# Patient Record
Sex: Male | Born: 1955 | Race: White | Hispanic: No | Marital: Married | Smoking: Never smoker
Health system: Southern US, Community
[De-identification: ages and names within clinical notes are randomized; demographics above are authoritative.]

## PROBLEM LIST (undated history)

## (undated) DIAGNOSIS — T753XXA Motion sickness, initial encounter: Secondary | ICD-10-CM

## (undated) DIAGNOSIS — M1990 Unspecified osteoarthritis, unspecified site: Secondary | ICD-10-CM

## (undated) HISTORY — PX: WISDOM TOOTH EXTRACTION: SHX21

## (undated) HISTORY — PX: COLONOSCOPY: SHX174

---

## 1978-03-04 HISTORY — PX: KNEE ARTHROSCOPY: SUR90

## 2007-07-16 ENCOUNTER — Ambulatory Visit: Payer: Self-pay | Admitting: Unknown Physician Specialty

## 2012-07-01 ENCOUNTER — Encounter (HOSPITAL_BASED_OUTPATIENT_CLINIC_OR_DEPARTMENT_OTHER): Payer: Self-pay | Admitting: *Deleted

## 2012-07-01 ENCOUNTER — Other Ambulatory Visit: Payer: Self-pay | Admitting: Orthopedic Surgery

## 2012-07-01 NOTE — Progress Notes (Signed)
No labs needed

## 2012-07-01 NOTE — H&P (Addendum)
  Ronald Watson is an 57 y.o. male.   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  On 06/29/12 he sustained a direct blow and laceration to the ulnar aspect of his right small finger.  He was seen at the [facility] where his wound was cleaned and sutured. He was provided tetanus prophylaxis.  He is noted subluxation of his extensor tendon with flexion of the finger.  He is now referred for an upper extremity orthopaedic consult to correct his finger predicament.    Past Medical History  Diagnosis Date  . Medical history non-contributory     Past Surgical History  Procedure Laterality Date  . Wisdom tooth extraction    . Knee arthroscopy  1980    left  . Colonoscopy      History reviewed. No pertinent family history. Social History:  reports that he has never smoked. He does not have any smokeless tobacco history on file. He reports that  drinks alcohol. He reports that he does not use illicit drugs.  Allergies: No Known Allergies  No prescriptions prior to admission    No results found for this or any previous visit (from the past 48 hour(s)).  No results found.   Pertinent items are noted in HPI.  Height 6' (1.829 m), weight 96.163 kg (212 lb).  General appearance: alert Head: Normocephalic, without obvious abnormality Neck: supple, symmetrical, trachea midline Resp: clear to auscultation bilaterally Cardio: regular rate and rhythm GI: normal findings: bowel sounds normal Extremities:  Inspection of his hand reveals sutured wound. There is no sign of infection. He can fully extend the finger at the PIP joint, however, with flexion his extensor tendon sublux radially.  He appears to have a disruption of his ulnar sagittal fibers of the extensor retinaculum.  He has intact sensibility. His flexor tendon function appears to be normal.    X-rays, three  views of the small finger from the [facility] dated 06/29/12 are reviewed. He does not appear to have any significant fracture predicament.   Pulses: 2+ and symmetric Skin: normal Neurologic: Grossly normal    Assessment/Plan Impression:Extensor tendon laceration right small finger  Plan:To the OR for exploration/repair tendons right small finger.The procedure, risks,benefits and post-op course were discussed with the patient at length and they were in agreement with the plan.  Kimmi Acocella J 07/01/2012, 3:31 PM   H&P documentation: 07/02/2012  -History and Physical Reviewed  -Patient has been re-examined  -No change in the plan of care  Wyn Forster, MD

## 2012-07-02 ENCOUNTER — Encounter (HOSPITAL_BASED_OUTPATIENT_CLINIC_OR_DEPARTMENT_OTHER): Payer: Self-pay | Admitting: Anesthesiology

## 2012-07-02 ENCOUNTER — Encounter (HOSPITAL_BASED_OUTPATIENT_CLINIC_OR_DEPARTMENT_OTHER): Payer: Self-pay | Admitting: *Deleted

## 2012-07-02 ENCOUNTER — Encounter (HOSPITAL_BASED_OUTPATIENT_CLINIC_OR_DEPARTMENT_OTHER)
Admission: RE | Disposition: A | Discharge status: Home / Self Care | Payer: Self-pay | Source: Ambulatory Visit | Attending: Orthopedic Surgery

## 2012-07-02 ENCOUNTER — Ambulatory Visit (HOSPITAL_BASED_OUTPATIENT_CLINIC_OR_DEPARTMENT_OTHER)
Admission: RE | Admit: 2012-07-02 | Discharge: 2012-07-02 | Disposition: A | Discharge status: Home / Self Care | Payer: Worker's Compensation | Source: Ambulatory Visit | Attending: Orthopedic Surgery | Admitting: Orthopedic Surgery

## 2012-07-02 DIAGNOSIS — X58XXXA Exposure to other specified factors, initial encounter: Secondary | ICD-10-CM | POA: Insufficient documentation

## 2012-07-02 DIAGNOSIS — S61209A Unspecified open wound of unspecified finger without damage to nail, initial encounter: Secondary | ICD-10-CM | POA: Insufficient documentation

## 2012-07-02 HISTORY — PX: TENDON REPAIR: SHX5111

## 2012-07-02 SURGERY — MINOR TENDON REPAIR
Anesthesia: LOCAL | Site: Finger | Laterality: Right | Wound class: Clean

## 2012-07-02 MED ORDER — LIDOCAINE HCL 2 % IJ SOLN
INTRAMUSCULAR | Status: DC | PRN
  Administered 2012-07-02: 3 mL

## 2012-07-02 MED ORDER — ACETAMINOPHEN-CODEINE #3 300-30 MG PO TABS: 1.0000 | ORAL_TABLET | ORAL | Status: DC | PRN

## 2012-07-02 MED ORDER — LACTATED RINGERS IV SOLN
INTRAVENOUS | Status: DC
  Administered 2012-07-02: 07:00:00 via INTRAVENOUS

## 2012-07-02 MED ORDER — CEPHALEXIN 500 MG PO CAPS: 500.0000 mg | ORAL_CAPSULE | Freq: Three times a day (TID) | ORAL | Status: DC

## 2012-07-02 MED ORDER — MIDAZOLAM HCL 2 MG/2ML IJ SOLN: 1.0000 mg | INTRAMUSCULAR | Status: DC | PRN

## 2012-07-02 MED ORDER — FENTANYL CITRATE 0.05 MG/ML IJ SOLN: 50.0000 ug | INTRAMUSCULAR | Status: DC | PRN

## 2012-07-02 SURGICAL SUPPLY — 79 items
BANDAGE ADHESIVE 1X3 (GAUZE/BANDAGES/DRESSINGS) IMPLANT
BANDAGE ELASTIC 3 VELCRO ST LF (GAUZE/BANDAGES/DRESSINGS) IMPLANT
BANDAGE GAUZE ELAST BULKY 4 IN (GAUZE/BANDAGES/DRESSINGS) IMPLANT
BLADE MINI RND TIP GREEN BEAV (BLADE) ×2 IMPLANT
BLADE SURG 15 STRL LF DISP TIS (BLADE) ×1 IMPLANT
BLADE SURG 15 STRL SS (BLADE) ×1
BNDG COHESIVE 1X5 TAN STRL LF (GAUZE/BANDAGES/DRESSINGS) ×2 IMPLANT
BNDG ELASTIC 2 VLCR STRL LF (GAUZE/BANDAGES/DRESSINGS) IMPLANT
BNDG ESMARK 4X9 LF (GAUZE/BANDAGES/DRESSINGS) IMPLANT
BRUSH SCRUB EZ PLAIN DRY (MISCELLANEOUS) ×2 IMPLANT
CATH ROBINSON RED A/P 10FR (CATHETERS) IMPLANT
CATH ROBINSON RED A/P 12FR (CATHETERS) IMPLANT
CLOTH BEACON ORANGE TIMEOUT ST (SAFETY) ×2 IMPLANT
CORDS BIPOLAR (ELECTRODE) ×2 IMPLANT
COVER MAYO STAND STRL (DRAPES) ×2 IMPLANT
COVER TABLE BACK 60X90 (DRAPES) ×2 IMPLANT
CUFF TOURNIQUET SINGLE 18IN (TOURNIQUET CUFF) IMPLANT
DECANTER SPIKE VIAL GLASS SM (MISCELLANEOUS) IMPLANT
DRAIN PENROSE 1/4X12 LTX STRL (WOUND CARE) IMPLANT
DRAPE EXTREMITY T 121X128X90 (DRAPE) ×2 IMPLANT
DRAPE SURG 17X23 STRL (DRAPES) ×2 IMPLANT
GAUZE SPONGE 4X4 16PLY XRAY LF (GAUZE/BANDAGES/DRESSINGS) IMPLANT
GAUZE XEROFORM 1X8 LF (GAUZE/BANDAGES/DRESSINGS) ×2 IMPLANT
GLOVE BIOGEL M STRL SZ7.5 (GLOVE) ×2 IMPLANT
GLOVE BIOGEL PI IND STRL 6.5 (GLOVE) ×2 IMPLANT
GLOVE BIOGEL PI INDICATOR 6.5 (GLOVE) ×2
GLOVE ECLIPSE 6.5 STRL STRAW (GLOVE) ×2 IMPLANT
GLOVE ORTHO TXT STRL SZ7.5 (GLOVE) ×2 IMPLANT
GOWN BRE IMP PREV XXLGXLNG (GOWN DISPOSABLE) ×4 IMPLANT
GOWN PREVENTION PLUS XLARGE (GOWN DISPOSABLE) ×2 IMPLANT
NEEDLE 27GAX1X1/2 (NEEDLE) IMPLANT
NEEDLE ADDISON D1/2 CIR (NEEDLE) IMPLANT
NEEDLE HYPO 25X1 1.5 SAFETY (NEEDLE) IMPLANT
NEEDLE KEITH (NEEDLE) IMPLANT
NEEDLE KEITH SZ10 STRAIGHT (NEEDLE) IMPLANT
NS IRRIG 1000ML POUR BTL (IV SOLUTION) ×2 IMPLANT
PACK BASIN DAY SURGERY FS (CUSTOM PROCEDURE TRAY) ×2 IMPLANT
PAD CAST 3X4 CTTN HI CHSV (CAST SUPPLIES) IMPLANT
PADDING CAST ABS 3INX4YD NS (CAST SUPPLIES)
PADDING CAST ABS 4INX4YD NS (CAST SUPPLIES) ×1
PADDING CAST ABS COTTON 3X4 (CAST SUPPLIES) IMPLANT
PADDING CAST ABS COTTON 4X4 ST (CAST SUPPLIES) ×1 IMPLANT
PADDING CAST COTTON 3X4 STRL (CAST SUPPLIES)
PASSER SUT SWANSON 36MM LOOP (INSTRUMENTS) IMPLANT
SLEEVE SCD COMPRESS KNEE MED (MISCELLANEOUS) IMPLANT
SPLINT FINGER 5/8X3.25 (SOFTGOODS) ×1 IMPLANT
SPLINT FINGER FOAM 3 9119 05 (SOFTGOODS) ×2
SPLINT PLASTER CAST XFAST 3X15 (CAST SUPPLIES) IMPLANT
SPLINT PLASTER XTRA FASTSET 3X (CAST SUPPLIES)
SPONGE GAUZE 4X4 12PLY (GAUZE/BANDAGES/DRESSINGS) ×2 IMPLANT
STOCKINETTE 4X48 STRL (DRAPES) ×2 IMPLANT
STOCKINETTE 6  STRL (DRAPES)
STOCKINETTE 6 STRL (DRAPES) IMPLANT
STRIP CLOSURE SKIN 1/2X4 (GAUZE/BANDAGES/DRESSINGS) ×2 IMPLANT
SUT ETHIBOND 3-0 V-5 (SUTURE) IMPLANT
SUT ETHILON 5 0 P 3 18 (SUTURE)
SUT FIBERWIRE 3-0 18 TAPR NDL (SUTURE)
SUT FIBERWIRE 4-0 18 TAPR NDL (SUTURE)
SUT MERSILENE 4 0 P 3 (SUTURE) ×2 IMPLANT
SUT MERSILENE 6 0 P 1 (SUTURE) IMPLANT
SUT MERSILENE 6 0 S14 DA (SUTURE) IMPLANT
SUT NYLON ETHILON 5-0 P-3 1X18 (SUTURE) IMPLANT
SUT POLY BUTTON 15MM (SUTURE) IMPLANT
SUT PROLENE 2 0 SH DA (SUTURE) IMPLANT
SUT PROLENE 3 0 PS 2 (SUTURE) IMPLANT
SUT PROLENE 4 0 PS 2 18 (SUTURE) ×2 IMPLANT
SUT SILK 4 0 PS 2 (SUTURE) ×2 IMPLANT
SUT VIC AB 4-0 SH 27 (SUTURE)
SUT VIC AB 4-0 SH 27XANBCTRL (SUTURE) IMPLANT
SUTURE FIBERWR 3-0 18 TAPR NDL (SUTURE) IMPLANT
SUTURE FIBERWR 4-0 18 TAPR NDL (SUTURE) IMPLANT
SYR 3ML 23GX1 SAFETY (SYRINGE) IMPLANT
SYR 5ML LL (SYRINGE) ×2 IMPLANT
SYR BULB 3OZ (MISCELLANEOUS) IMPLANT
SYR CONTROL 10ML LL (SYRINGE) ×2 IMPLANT
TOWEL OR 17X24 6PK STRL BLUE (TOWEL DISPOSABLE) ×2 IMPLANT
TRAY DSU PREP LF (CUSTOM PROCEDURE TRAY) ×2 IMPLANT
TUBE FEEDING 5FR 15 INCH (TUBING) IMPLANT
UNDERPAD 30X30 INCONTINENT (UNDERPADS AND DIAPERS) ×2 IMPLANT

## 2012-07-02 NOTE — Op Note (Signed)
[id number] 

## 2012-07-02 NOTE — Anesthesia Preprocedure Evaluation (Deleted)

## 2012-07-02 NOTE — Op Note (Signed)
NAME:  Ronald Watson, Ronald Watson                ACCOUNT NO.:  [id number]  MEDICAL RECORD NO.:  [id number]  LOCATION:                                 FACILITY:  PHYSICIAN:  Ronald Watson, M.D. DATE OF BIRTH:  1956/02/20  DATE OF PROCEDURE:  07/02/2012 DATE OF DISCHARGE:                              OPERATIVE REPORT   PREOPERATIVE DIAGNOSIS:  Laceration dorsal aspect, right small finger overlying the PIP joint with extensor subluxation due to partial extensor laceration and extensor retinacular laceration.  POSTOPERATIVE DIAGNOSIS:  A 50% laceration of extensor overlying proximal interphalangeal joint including central slip and lateral band, and transverse retinacular fibers.  OPERATION:  Repair of extensor mechanism overlying the PIP joint, right small finger.  OPERATING SURGEON:  Ronald Fitch. Aleister Lady, MD.  ASSISTANT:  Surgical technician.  ANESTHESIA:  2% lidocaine metacarpal head level block, right small finger 3 mL of 2% lidocaine without epinephrine.  ANESTHETIST:  Ronald Watson, M.D.  This was a Ronald Watson operating room procedure.  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  [facility] where he was noted to have an extensor tendon laceration. He was given tetanus prophylaxis.  His wound was cleansed and sutured with nylon suture.  He was referred for a followup with Orthopedic and Hand specialist.  Clinical examination revealed extensor impairment with triggering and near locking in partial flexion due to extensor subluxation.  We advised exploration and repair of the extensor mechanism.  Preoperative x-rays revealed no abnormalities other than soft tissue injury.  Preoperative detailed informed consent was provided in the office and in the holding area.  Questions were invited and answered in detail.  DESCRIPTION OF PROCEDURE:  Ronald Watson was interviewed in the  holding area, and his right small finger was marked as the proper surgical site per protocol.  He was transferred to room 1 of the [facility] where Ronald [facility] was used to prep his hand followed by infiltration of 3 mL of metacarpal head level to obtain a digital block.  His right hand and arm were then prepped with Ronald Watson, sterilely draped.  A pneumatic tourniquet was applied to the proximal forearm.  Sterile stockinette and impervious arthroscopy drapes were applied.  Following the routine surgical time-out, procedure commenced with removal of the prior sutures from the [facility].  The wound was explored and there was noted to be a 50% laceration of the extensor mechanism including part of the central slip.  The ulnar lateral band and the transverse retinacular fibers.  An incision was fashioned in the midline dorsally over the most radial aspect of the wound to allow exposure of the proximal extensor mechanism.  The wound was irrigated, and the tendon subsequently elevated to explore the periosteum.  There was no significant periosteal or joint capsule laceration.  The extensor tendon was then repaired with grasping suture of 4-0 Ronald Watson and finishing suture of 4-0 Ronald Watson.  An anatomic repair was achieved.  The finger was maintained in full extension during repair.  The skin was repaired with intradermal 4-0 Ronald Watson  followed by placement of a soft dressing with aluminum foam splint maintaining the PIP joint at 0 degrees flexion.  There were no apparent complications.  The forearm tourniquet was released with immediate capillary refill of the fingers and thumb.  Mr. Scheirer was escorted back to the holding area where he will be discharged with prescriptions for Ronald Watson with Ronald Watson, #3 one or two tablets p.o. q.4-6 hours p.r.n. pain, 20 tablets without refill as an analgesic and Ronald Watson 5 mg 1 p.o. q.8 hours x4 days as  a prophylactic antibiotic.     Ronald Fitch [facility], M.D.     RVS/MEDQ  D:  07/02/2012  T:  07/02/2012  Job:  [id number]

## 2012-07-02 NOTE — Brief Op Note (Signed)
07/02/2012  8:22 AM  PATIENT:  Ronald Watson  57 y.o. male  PRE-OPERATIVE DIAGNOSIS:  Right Small Finger Laceration of Extensor Tendon  POST-OPERATIVE DIAGNOSIS:  50% laceration of left small finger extensor tendon  PROCEDURE:  Repair extensor of left small finger  SURGEON:  Surgeon(s) and Role:    * Wyn Forster., MD - Primary  PHYSICIAN ASSISTANT:   ASSISTANTS: surgical technician  ANESTHESIA:   local  EBL:     BLOOD ADMINISTERED:none  DRAINS: none   LOCAL MEDICATIONS USED:  LIDOCAINE   SPECIMEN:  No Specimen  DISPOSITION OF SPECIMEN:  N/A  COUNTS:  YES  TOURNIQUET:   Total Tourniquet Time Documented: Forearm (Right) - 11 minutes Total: Forearm (Right) - 11 minutes   DICTATION: .Other Dictation: Dictation Number [id number]  PLAN OF CARE: Discharge to home after PACU  PATIENT DISPOSITION:  PACU - hemodynamically stable.   Delay start of Pharmacological VTE agent (>24hrs) due to surgical blood loss or risk of bleeding: not applicable

## 2012-07-03 ENCOUNTER — Encounter (HOSPITAL_BASED_OUTPATIENT_CLINIC_OR_DEPARTMENT_OTHER): Payer: Self-pay | Admitting: Orthopedic Surgery

## 2013-12-16 ENCOUNTER — Ambulatory Visit: Payer: Self-pay | Admitting: Otolaryngology

## 2016-11-12 ENCOUNTER — Other Ambulatory Visit: Payer: Self-pay | Admitting: Orthopedic Surgery

## 2016-11-12 DIAGNOSIS — M25561 Pain in right knee: Secondary | ICD-10-CM

## 2016-11-19 ENCOUNTER — Ambulatory Visit: Payer: BLUE CROSS/BLUE SHIELD

## 2016-11-25 ENCOUNTER — Ambulatory Visit
Admission: RE | Admit: 2016-11-25 | Discharge: 2016-11-25 | Disposition: A | Discharge status: Home / Self Care | Payer: BLUE CROSS/BLUE SHIELD | Source: Ambulatory Visit | Attending: Orthopedic Surgery | Admitting: Orthopedic Surgery

## 2016-11-25 DIAGNOSIS — M25461 Effusion, right knee: Secondary | ICD-10-CM | POA: Insufficient documentation

## 2016-11-25 DIAGNOSIS — M794 Hypertrophy of (infrapatellar) fat pad: Secondary | ICD-10-CM | POA: Insufficient documentation

## 2016-11-25 DIAGNOSIS — M25561 Pain in right knee: Secondary | ICD-10-CM

## 2016-11-25 DIAGNOSIS — M23203 Derangement of unspecified medial meniscus due to old tear or injury, right knee: Secondary | ICD-10-CM | POA: Insufficient documentation

## 2016-11-28 ENCOUNTER — Encounter: Payer: Self-pay | Admitting: *Deleted

## 2016-12-05 ENCOUNTER — Ambulatory Visit: Payer: BLUE CROSS/BLUE SHIELD | Admitting: Anesthesiology

## 2016-12-05 ENCOUNTER — Encounter
Admission: RE | Disposition: A | Discharge status: Home / Self Care | Payer: Self-pay | Source: Ambulatory Visit | Attending: Orthopedic Surgery

## 2016-12-05 ENCOUNTER — Ambulatory Visit
Admission: RE | Admit: 2016-12-05 | Discharge: 2016-12-05 | Disposition: A | Discharge status: Home / Self Care | Payer: BLUE CROSS/BLUE SHIELD | Source: Ambulatory Visit | Attending: Orthopedic Surgery | Admitting: Orthopedic Surgery

## 2016-12-05 ENCOUNTER — Encounter: Payer: Self-pay | Admitting: Anesthesiology

## 2016-12-05 DIAGNOSIS — M23231 Derangement of other medial meniscus due to old tear or injury, right knee: Secondary | ICD-10-CM | POA: Insufficient documentation

## 2016-12-05 DIAGNOSIS — Z8249 Family history of ischemic heart disease and other diseases of the circulatory system: Secondary | ICD-10-CM | POA: Insufficient documentation

## 2016-12-05 DIAGNOSIS — M1711 Unilateral primary osteoarthritis, right knee: Secondary | ICD-10-CM | POA: Diagnosis not present

## 2016-12-05 DIAGNOSIS — E782 Mixed hyperlipidemia: Secondary | ICD-10-CM | POA: Insufficient documentation

## 2016-12-05 HISTORY — DX: Unspecified osteoarthritis, unspecified site: M19.90

## 2016-12-05 HISTORY — PX: CHONDROPLASTY: SHX5177

## 2016-12-05 HISTORY — PX: KNEE ARTHROSCOPY WITH MENISCAL REPAIR: SHX5653

## 2016-12-05 HISTORY — DX: Motion sickness, initial encounter: T75.3XXA

## 2016-12-05 SURGERY — ARTHROSCOPY, KNEE, WITH MENISCUS REPAIR
Anesthesia: General | Laterality: Right | Wound class: Clean

## 2016-12-05 MED ORDER — ONDANSETRON HCL 4 MG/2ML IJ SOLN
INTRAMUSCULAR | Status: DC | PRN
  Administered 2016-12-05: 4 mg via INTRAVENOUS

## 2016-12-05 MED ORDER — OXYCODONE HCL 5 MG PO TABS
5.0000 mg | ORAL_TABLET | Freq: Once | ORAL | Status: AC | PRN
  Administered 2016-12-05: 5 mg via ORAL

## 2016-12-05 MED ORDER — DEXAMETHASONE SODIUM PHOSPHATE 4 MG/ML IJ SOLN
INTRAMUSCULAR | Status: DC | PRN
  Administered 2016-12-05: 4 mg via INTRAVENOUS

## 2016-12-05 MED ORDER — BUPIVACAINE HCL 0.5 % IJ SOLN
INTRAMUSCULAR | Status: DC | PRN
  Administered 2016-12-05: 30 mL

## 2016-12-05 MED ORDER — HYDROCODONE-ACETAMINOPHEN 5-325 MG PO TABS: 1.0000 | ORAL_TABLET | ORAL | 0 refills | Status: AC | PRN

## 2016-12-05 MED ORDER — FENTANYL CITRATE (PF) 100 MCG/2ML IJ SOLN: 25.0000 ug | INTRAMUSCULAR | Status: DC | PRN

## 2016-12-05 MED ORDER — LACTATED RINGERS IV SOLN
INTRAVENOUS | Status: DC
  Administered 2016-12-05: 12:00:00 via INTRAVENOUS

## 2016-12-05 MED ORDER — ASPIRIN EC 325 MG PO TBEC: 325.0000 mg | DELAYED_RELEASE_TABLET | Freq: Every day | ORAL | 0 refills | Status: AC

## 2016-12-05 MED ORDER — PROPOFOL 10 MG/ML IV BOLUS
INTRAVENOUS | Status: DC | PRN
  Administered 2016-12-05: 140 mg via INTRAVENOUS

## 2016-12-05 MED ORDER — IBUPROFEN 800 MG PO TABS: 800.0000 mg | ORAL_TABLET | Freq: Three times a day (TID) | ORAL | 0 refills | Status: AC

## 2016-12-05 MED ORDER — ONDANSETRON 4 MG PO TBDP: 4.0000 mg | ORAL_TABLET | Freq: Three times a day (TID) | ORAL | 0 refills | Status: AC | PRN

## 2016-12-05 MED ORDER — GLYCOPYRROLATE 0.2 MG/ML IJ SOLN
INTRAMUSCULAR | Status: DC | PRN
  Administered 2016-12-05: 0.1 mg via INTRAVENOUS

## 2016-12-05 MED ORDER — CEFAZOLIN SODIUM-DEXTROSE 2-4 GM/100ML-% IV SOLN
2.0000 g | Freq: Once | INTRAVENOUS | Status: AC
  Administered 2016-12-05: 2 g via INTRAVENOUS

## 2016-12-05 MED ORDER — LIDOCAINE-EPINEPHRINE 1 %-1:100000 IJ SOLN
INTRAMUSCULAR | Status: DC | PRN
  Administered 2016-12-05: 30 mL

## 2016-12-05 MED ORDER — ONDANSETRON HCL 4 MG/2ML IJ SOLN: 4.0000 mg | Freq: Once | INTRAMUSCULAR | Status: DC | PRN

## 2016-12-05 MED ORDER — LIDOCAINE HCL (CARDIAC) 20 MG/ML IV SOLN
INTRAVENOUS | Status: DC | PRN
  Administered 2016-12-05: 40 mg via INTRATRACHEAL

## 2016-12-05 MED ORDER — KETOROLAC TROMETHAMINE 30 MG/ML IJ SOLN
INTRAMUSCULAR | Status: DC | PRN
  Administered 2016-12-05: 30 mg via INTRAVENOUS

## 2016-12-05 MED ORDER — MIDAZOLAM HCL 5 MG/5ML IJ SOLN
INTRAMUSCULAR | Status: DC | PRN
  Administered 2016-12-05: 2 mg via INTRAVENOUS

## 2016-12-05 MED ORDER — OXYCODONE HCL 5 MG/5ML PO SOLN: 5.0000 mg | Freq: Once | ORAL | Status: AC | PRN

## 2016-12-05 MED ORDER — FENTANYL CITRATE (PF) 100 MCG/2ML IJ SOLN
INTRAMUSCULAR | Status: DC | PRN
  Administered 2016-12-05: 100 ug via INTRAVENOUS

## 2016-12-05 SURGICAL SUPPLY — 59 items
ADAPTER IRRIG TUBE 2 SPIKE SOL (ADAPTER) ×3 IMPLANT
BLADE SURG 15 STRL LF DISP TIS (BLADE) ×1 IMPLANT
BLADE SURG 15 STRL SS (BLADE) ×2
BLADE SURG SZ11 CARB STEEL (BLADE) ×3 IMPLANT
BNDG COHESIVE 4X5 TAN STRL (GAUZE/BANDAGES/DRESSINGS) ×3 IMPLANT
BNDG ESMARK 6X12 TAN STRL LF (GAUZE/BANDAGES/DRESSINGS) IMPLANT
BRACE KNEE POST OP SHORT (BRACE) IMPLANT
BRUSH SCRUB EZ  4% CHG (MISCELLANEOUS)
BRUSH SCRUB EZ 4% CHG (MISCELLANEOUS) IMPLANT
BUR RADIUS 3.5 (BURR) ×3 IMPLANT
BUR RADIUS 4.0X18.5 (BURR) IMPLANT
BUTTON SUTURE 12 NAB (SUTURE) IMPLANT
CHLORAPREP W/TINT 26ML (MISCELLANEOUS) ×3 IMPLANT
CLEANER CAUTERY TIP 5X5 PAD (MISCELLANEOUS) IMPLANT
CLOSURE WOUND 1/2 X4 (GAUZE/BANDAGES/DRESSINGS) ×1
COOLER POLAR GLACIER W/PUMP (MISCELLANEOUS) ×3 IMPLANT
CUFF TOURN SGL QUICK 24 (TOURNIQUET CUFF) ×2
CUFF TOURN SGL QUICK 30 (MISCELLANEOUS)
CUFF TOURN SGL QUICK 34 (TOURNIQUET CUFF)
CUFF TRNQT CYL 24X4X40X1 (TOURNIQUET CUFF) ×1 IMPLANT
CUFF TRNQT CYL 34X4X40X1 (TOURNIQUET CUFF) IMPLANT
CUFF TRNQT CYL LO 30X4X (MISCELLANEOUS) IMPLANT
DRAPE IMP U-DRAPE 54X76 (DRAPES) ×3 IMPLANT
DRILL FLIPCUTTER II W/DRILL 6 (INSTRUMENTS) ×1 IMPLANT
FLIPCUTTER II W/DRILL 6 (INSTRUMENTS) ×3
GAUZE SPONGE 4X4 12PLY STRL (GAUZE/BANDAGES/DRESSINGS) ×3 IMPLANT
GLOVE BIO SURGEON STRL SZ7.5 (GLOVE) ×3 IMPLANT
GLOVE BIOGEL PI IND STRL 8 (GLOVE) ×1 IMPLANT
GLOVE BIOGEL PI INDICATOR 8 (GLOVE) ×2
GOWN STRL REUS W/ TWL LRG LVL3 (GOWN DISPOSABLE) ×1 IMPLANT
GOWN STRL REUS W/TWL LRG LVL3 (GOWN DISPOSABLE) ×5 IMPLANT
IV LACTATED RINGER IRRG 3000ML (IV SOLUTION) ×8
IV LR IRRIG 3000ML ARTHROMATIC (IV SOLUTION) ×4 IMPLANT
KIT RM TURNOVER STRD PROC AR (KITS) ×3 IMPLANT
MAT BLUE FLOOR 46X72 FLO (MISCELLANEOUS) ×3 IMPLANT
NDL MAYO CATGUT SZ5 (NEEDLE)
NDL SUT 5 .5 CRC TPR PNT MAYO (NEEDLE) IMPLANT
NEPTUNE MANIFOLD (MISCELLANEOUS) ×3 IMPLANT
PACK ARTHROSCOPY KNEE (MISCELLANEOUS) ×3 IMPLANT
PAD ABD DERMACEA PRESS 5X9 (GAUZE/BANDAGES/DRESSINGS) ×6 IMPLANT
PAD CLEANER CAUTERY TIP 5X5 (MISCELLANEOUS)
PAD WRAPON POLAR KNEE (MISCELLANEOUS) ×1 IMPLANT
PENCIL ELECTRO HAND CTR (MISCELLANEOUS) ×3 IMPLANT
SET TUBE SUCT SHAVER OUTFL 24K (TUBING) ×3 IMPLANT
STRIP CLOSURE SKIN 1/2X4 (GAUZE/BANDAGES/DRESSINGS) ×2 IMPLANT
SUT ETHILON 4-0 (SUTURE) ×2
SUT ETHILON 4-0 FS2 18XMFL BLK (SUTURE) ×1
SUT FIBERSTICK 2-0 (SUTURE) IMPLANT
SUT MNCRL 4-0 (SUTURE) ×2
SUT MNCRL 4-0 27XMFL (SUTURE) ×1
SUT VIC AB 2-0 SH 27 (SUTURE)
SUT VIC AB 2-0 SH 27XBRD (SUTURE) IMPLANT
SUTURE ETHLN 4-0 FS2 18XMF BLK (SUTURE) ×1 IMPLANT
SUTURE MNCRL 4-0 27XMF (SUTURE) ×1 IMPLANT
TOWEL OR 17X26 4PK STRL BLUE (TOWEL DISPOSABLE) ×6 IMPLANT
TUBING ARTHRO INFLOW-ONLY STRL (TUBING) ×3 IMPLANT
WAND HAND CNTRL MULTIVAC 50 (MISCELLANEOUS) IMPLANT
WAND HAND CNTRL MULTIVAC 90 (MISCELLANEOUS) ×3 IMPLANT
WRAPON POLAR PAD KNEE (MISCELLANEOUS) ×3

## 2016-12-05 NOTE — Anesthesia Postprocedure Evaluation (Signed)
Anesthesia Post Note  Patient: Ronald Watson  Procedure(s) Performed: KNEE ARTHROSCOPY WITH partial  MENISCAL REPAIR (Right ) CHONDROPLASTY (Right )  Patient location during evaluation: PACU Anesthesia Type: General Level of consciousness: awake and alert Pain management: pain level controlled Vital Signs Assessment: post-procedure vital signs reviewed and stable Respiratory status: spontaneous breathing Cardiovascular status: blood pressure returned to baseline Postop Assessment: no headache Anesthetic complications: no    Verner Chol, III,  Foster Sonnier D

## 2016-12-05 NOTE — Transfer of Care (Signed)
Immediate Anesthesia Transfer of Care Note  Patient: Ronald Watson  Procedure(s) Performed: KNEE ARTHROSCOPY WITH MEDIAL MENISCAL REPAIR (Right )  Patient Location: PACU  Anesthesia Type: General LMA  Level of Consciousness: awake, alert  and patient cooperative  Airway and Oxygen Therapy: Patient Spontanous Breathing and Patient connected to supplemental oxygen  Post-op Assessment: Post-op Vital signs reviewed, Patient's Cardiovascular Status Stable, Respiratory Function Stable, Patent Airway and No signs of Nausea or vomiting  Post-op Vital Signs: Reviewed and stable  Complications: No apparent anesthesia complications

## 2016-12-05 NOTE — Discharge Instructions (Signed)
Arthroscopic Knee Surgery - Partial Meniscectomy  Post-Op Instructions  1. Bracing or crutches: Crutches will be provided at the time of discharge from the surgery center if you do not already have them.  2. Ice: You may be provided with a device [facility]) that allows you to ice the affected area effectively. Otherwise you can ice manually.   3. Driving:  Plan on not driving for at least two weeks. Please note that you are advised NOT to drive while taking narcotic pain medications as you may be impaired and unsafe to drive.  4. Activity: Ankle pumps several times an hour while awake to prevent blood clots. Weight bearing: as tolerated. Use crutches for as needed (usually ~1 week or less) until pain allows you to ambulate without a limp. Bending and straightening the knee is unlimited. Elevate knee above heart level as much as possible for one week. Avoid standing more than 5 minutes (consecutively) for the first week.  Avoid long distance travel for 2 weeks.  5. Medications:  - You have been provided a prescription for narcotic pain medicine. After surgery, take 1-2 narcotic tablets every 4 hours if needed for severe pain.  - Take Tylenol  (3 regular strength or 2 extra strength tablets) three times a day until off of narcotic pain medications. Then wean yourself off of tylenol. - A prescription for anti-nausea medication will be provided in case the narcotic medicine causes nausea - take 1 tablet every 6 hours only if nauseated.  - Take ibuprofen 800 mg every 8 hours WITH food to reduce post-operative knee swelling. DO NOT STOP IBUPROFEN POST-OP UNTIL INSTRUCTED TO DO SO at first post-op office visit (10-14 days after surgery). However, please discontinue if you have any abdominal discomfort after taking this.  - Take enteric coated aspirin 325 mg once daily for 2 weeks to prevent blood clots.    6. Bandages: The physical therapist should change the bandages at the first post-op  appointment. If needed, the dressing supplies have been provided to you.  7. Physical Therapy: 1-2 times per week for 6 weeks. Therapy typically starts on post operative Day 3 or 4. You have been provided an order for physical therapy. The therapist will provide home exercises.  8. Work: May return to full work when off of crutches. May do light duty/desk job in approximately 1-2 weeks when off of narcotics, pain is well-controlled, and swelling has decreased.  9. Post-Op Appointments: Your first post-op appointment will be with Dr. Allena Katz in approximately 2 weeks time.   If you find that they have not been scheduled please call the Orthopaedic Appointment front desk at [phone].     General Anesthesia, Adult, Care After These instructions provide you with information about caring for yourself after your procedure. Your health care provider may also give you more specific instructions. Your treatment has been planned according to current medical practices, but problems sometimes occur. Call your health care provider if you have any problems or questions after your procedure. What can I expect after the procedure? After the procedure, it is common to have:  Vomiting.  A sore throat.  Mental slowness.  It is common to feel:  Nauseous.  Cold or shivery.  Sleepy.  Tired.  Sore or achy, even in parts of your body where you did not have surgery.  Follow these instructions at home: For at least 24 hours after the procedure:  Do not: ? Participate in activities where you could fall or become  injured. ? Drive. ? Use heavy machinery. ? Drink alcohol. ? Take sleeping pills or medicines that cause drowsiness. ? Make important decisions or sign legal documents. ? Take care of children on your own.  Rest. Eating and drinking  If you vomit, drink water, juice, or soup when you can drink without vomiting.  Drink enough fluid to keep your urine clear or pale  yellow.  Make sure you have little or no nausea before eating solid foods.  Follow the diet recommended by your health care provider. General instructions  Have a responsible adult stay with you until you are awake and alert.  Return to your normal activities as told by your health care provider. Ask your health care provider what activities are safe for you.  Take over-the-counter and prescription medicines only as told by your health care provider.  If you smoke, do not smoke without supervision.  Keep all follow-up visits as told by your health care provider. This is important. Contact a health care provider if:  You continue to have nausea or vomiting at home, and medicines are not helpful.  You cannot drink fluids or start eating again.  You cannot urinate after 8-12 hours.  You develop a skin rash.  You have fever.  You have increasing redness at the site of your procedure. Get help right away if:  You have difficulty breathing.  You have chest pain.  You have unexpected bleeding.  You feel that you are having a life-threatening or urgent problem. This information is not intended to replace advice given to you by your health care provider. Make sure you discuss any questions you have with your health care provider. Document Released: 05/27/2000 Document Revised: 07/24/2015 Document Reviewed: 02/02/2015 Elsevier Interactive Patient Education  [employer].

## 2016-12-05 NOTE — Anesthesia Procedure Notes (Signed)
Procedure Name: LMA Insertion Date/Time: 12/05/2016 1:13 PM Performed by: Jimmy Picket Pre-anesthesia Checklist: Patient identified, Emergency Drugs available, Suction available, Timeout performed and Patient being monitored Patient Re-evaluated:Patient Re-evaluated prior to induction Oxygen Delivery Method: Circle system utilized Preoxygenation: Pre-oxygenation with 100% oxygen Induction Type: IV induction LMA: LMA inserted LMA Size: 4.0 Number of attempts: 1 Placement Confirmation: positive ETCO2 and breath sounds checked- equal and bilateral Tube secured with: Tape

## 2016-12-05 NOTE — H&P (Signed)
Paper H&P to be scanned into permanent record. H&P reviewed. No significant changes noted.  

## 2016-12-05 NOTE — Anesthesia Preprocedure Evaluation (Addendum)
Anesthesia Evaluation  Patient identified by MRN, date of birth, ID band Patient awake    Reviewed: Allergy & Precautions, H&P , NPO status , Patient's Chart, lab work & pertinent test results  Airway Mallampati: II  TM Distance: >3 FB Neck ROM: full    Dental no notable dental hx.    Pulmonary neg pulmonary ROS,    Pulmonary exam normal        Cardiovascular negative cardio ROS Normal cardiovascular exam     Neuro/Psych    GI/Hepatic negative GI ROS, Neg liver ROS,   Endo/Other  negative endocrine ROS  Renal/GU negative Renal ROS     Musculoskeletal   Abdominal   Peds  Hematology negative hematology ROS (+)   Anesthesia Other Findings   Reproductive/Obstetrics                            Anesthesia Physical Anesthesia Plan  ASA: I  Anesthesia Plan: General LMA   Post-op Pain Management:    Induction:   PONV Risk Score and Plan:   Airway Management Planned:   Additional Equipment:   Intra-op Plan:   Post-operative Plan:   Informed Consent: I have reviewed the patients History and Physical, chart, labs and discussed the procedure including the risks, benefits and alternatives for the proposed anesthesia with the patient or authorized representative who has indicated his/her understanding and acceptance.     Plan Discussed with:   Anesthesia Plan Comments:         Anesthesia Quick Evaluation

## 2016-12-05 NOTE — Op Note (Signed)
Operative Note   SURGERY DATE: 12/05/2016  PRE-OP DIAGNOSIS:  1. Right medial meniscus tear  POST-OP DIAGNOSIS: 1. Right medial meniscus tear  PROCEDURES:  1. Right knee arthroscopy, partial medial meniscectomy 2. Chondroplasty of patellofemoral medial compartments  SURGEON: Rosealee Albee, MD  ANESTHESIA: Gen  ESTIMATED BLOOD LOSS:minimal  TOTAL IV FLUIDS: per anesthesia  INDICATION(S):  Ronald Watson is a 61 y.o. male who initially has had ~2 months of medial-sided knee pain without traumatic incident.  He feels sensations of catching, especially with twisting motions of the knee and pain is located along the medial joint line. An MRI showed a medial meniscus tear.  OPERATIVE FINDINGS:   Examination under anesthesia:A careful examination under anesthesia was performed. Passive range of motion was: Hyperextension: 1. Extension: 0. Flexion: 130. Lachman: normal. Pivot Shift: normal. Posterior drawer: normal. Varus stability in full extension: normal. Varus stability in 30 degrees of flexion: normal. Valgus stability in full extension: normal. Valgus stability in 30 degrees of flexion: normal.  Intra-operative findings:A thorough arthroscopic examination of the knee was performed. The findings are: 1. Suprapatellar pouch: Normal 2. Undersurface of median ridge: Normal 3. Medial patellar facet: Grade 2 degenerative changes 4. Lateral patellar facet: Grade 2 degenerative changes 5. Trochlea: Grade 1 degenerative changes 6. Lateral gutter/popliteus tendon: Normal 7. Hoffa's fat pad: Normal 8. Medial gutter/plica: Normal 9. ACL: Normal 10. PCL: Normal 11. Medial meniscus: Complex tear of the body (radial component of entire width of meniscus to capsule and horizontal component just posteriorly)  12. Medial compartment cartilage: Grade 3 degenerative changes 13. Lateral meniscus: Normal 14. Lateral compartment cartilage: Normal  OPERATIVE REPORT:    I identified Ronald C Minorin the pre-operative holding area. I marked theoperativeknee with my initials. I reviewed the risks and benefits of the proposed surgical intervention and the patient (and/or patient's guardian) wished to proceed. The patient was transferred to the operative suite and placed in the supine position with all bony prominences padded. Anesthesia was administered. Appropriate IV antibioticswere administered within 30 minutes before incision. The extremity was then prepped and draped in standard fashion. A time out was performed confirming the correct extremity, correct patient, and correct procedure.  Arthroscopy portals were marked. Local anesthetic was injected to the planned portal sites. The anterolateral portalwasestablished with an 11blade followed by asuperolateralportal to provide for outflow of the joint.   The arthroscope was placed in the anterolateral portal and theninto the suprapatellar pouch. A diagnostic knee scope was completed with the above findings. The medial meniscus tear was identified.  Next the medial portal was established under needle localization. The meniscal tear was debrided using an arthroscopic biter and an oscillating shaver until the meniscus had stable borders. A chondroplasty was performed of the medial compartment and patellofemoral compartment such that there were stable cartilage edges without any loose fragments of cartilage. Arthroscopic fluid was removed from the joint.  The portals were closed with 3-0 Nylon suture. Sterile dressings included Xeroform, 4x4s, Sof-Rol, and Bias wrap. A Polarcare was placed.  The patient was then awakened and taken to the PACU hemodynamically stable without complication.   POSTOPERATIVE PLAN: The patient will be discharged home today once they meet PACU criteria. Aspirin 325 mg daily was prescribed for 2 weeks for DVT prophylaxis. Physical therapy will start on  POD#3-4.Weight-bearing as tolerated. They will follow up in 2 weeks per protocol.

## 2016-12-06 ENCOUNTER — Encounter: Payer: Self-pay | Admitting: Orthopedic Surgery

## 2019-01-20 IMAGING — MR MR KNEE*R* W/O CM
5 series · 40 of 40 positions shown · non-contrast
Comparison: None.

CLINICAL DATA: Right knee pain medial to the patella. Duration: 5
weeks.

EXAM:
MRI OF THE RIGHT KNEE WITHOUT CONTRAST
TECHNIQUE: Multiplanar, multisequence MR imaging of the knee was performed. No
intravenous contrast was administered.

[Series 3: PD fat-sat · axial · 3.0mm · 0.62mm/px · z∈[-64,+48]mm · 8 of 35 slices shown (1 of 3)]
[im 1/35]
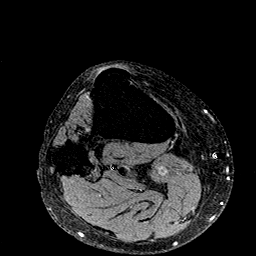
[im 5/35]
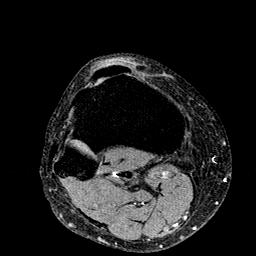
[im 10/35]
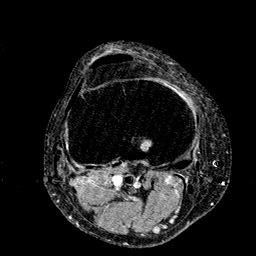
[im 15/35]
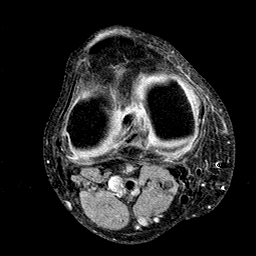
[im 20/35]
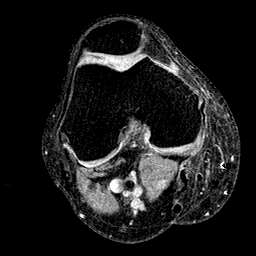
[im 25/35]
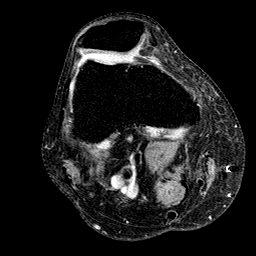
[im 30/35]
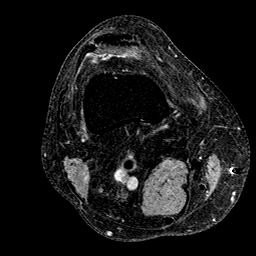
[im 35/35]
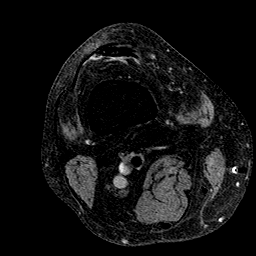

[Series 4: T2 fat-sat · coronal · 3.0mm · 0.62mm/px · 8 of 35 slices shown]
[im 1/35]
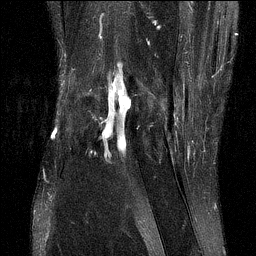
[im 5/35]
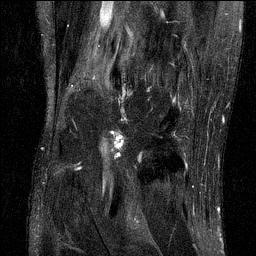
[im 10/35]
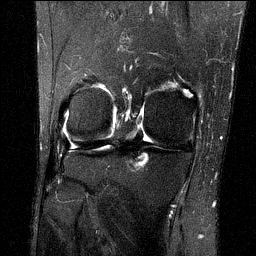
[im 15/35]
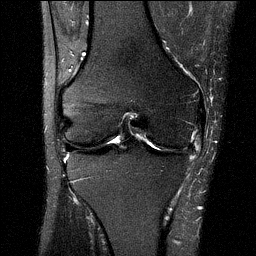
[im 20/35]
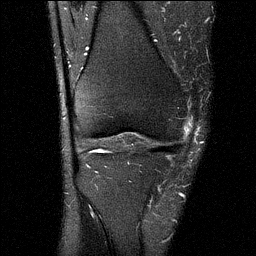
[im 25/35]
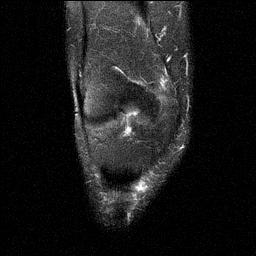
[im 30/35]
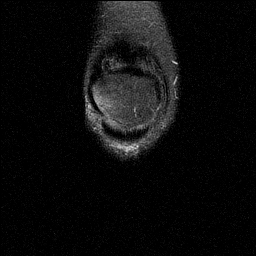
[im 35/35]
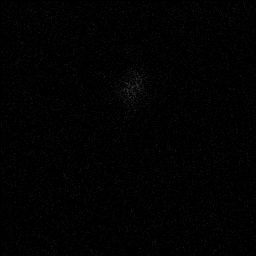

[Series 5: T1 · coronal · 3.0mm · 0.62mm/px · 8 of 35 slices shown]
[im 1/35]
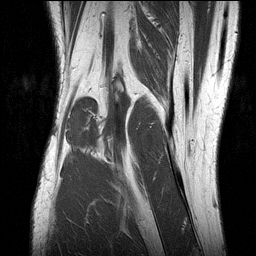
[im 5/35]
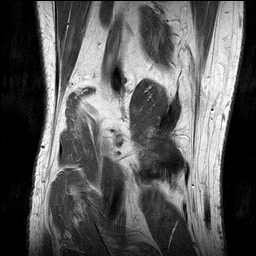
[im 10/35]
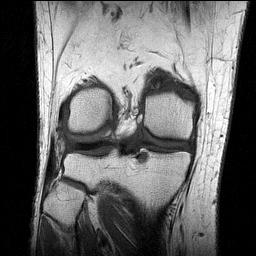
[im 15/35]
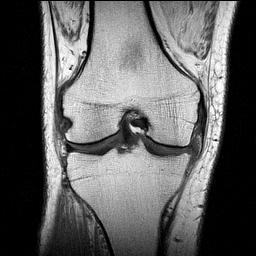
[im 20/35]
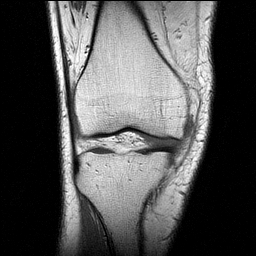
[im 25/35]
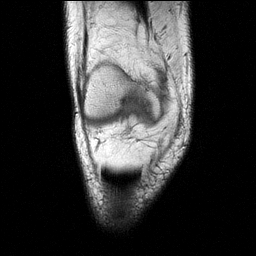
[im 30/35]
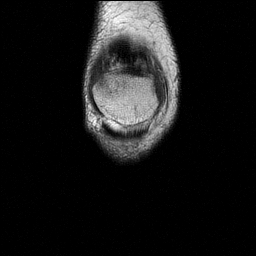
[im 35/35]
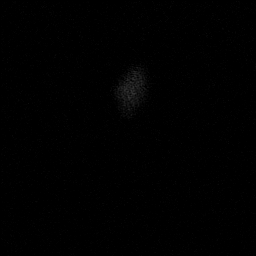

[Series 6: PD fat-sat · coronal · 3.0mm · 0.62mm/px · 8 of 35 slices shown (2 of 3)]
[im 1/35]
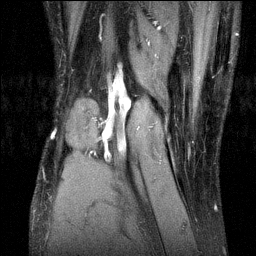
[im 5/35]
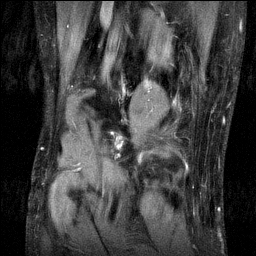
[im 10/35]
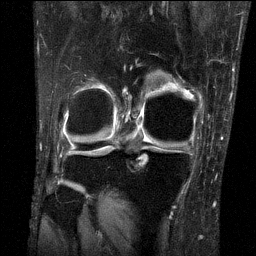
[im 15/35]
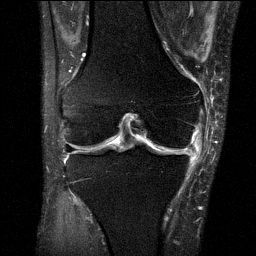
[im 20/35]
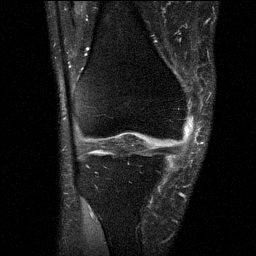
[im 25/35]
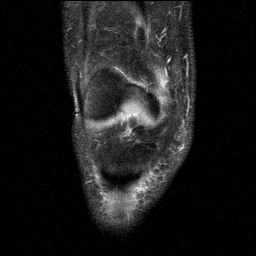
[im 30/35]
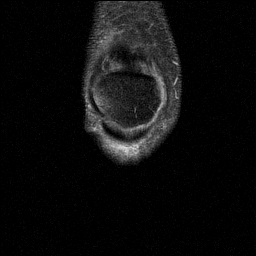
[im 35/35]
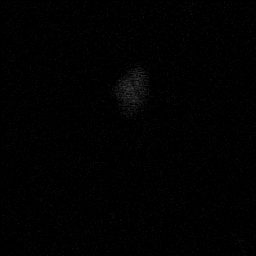

[Series 7: PD fat-sat · sagittal · 3.0mm · 0.62mm/px · 8 of 35 slices shown (3 of 3)]
[im 1/35]
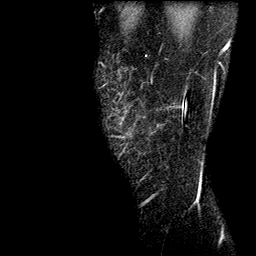
[im 5/35]
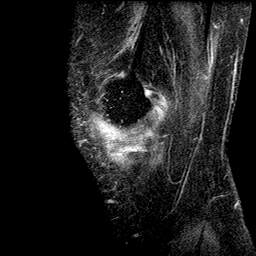
[im 10/35]
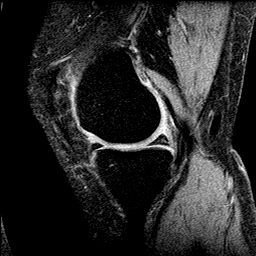
[im 15/35]
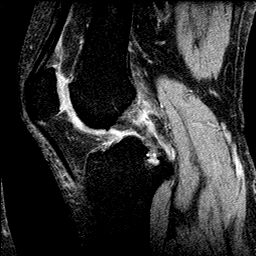
[im 20/35]
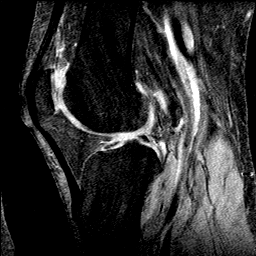
[im 25/35]
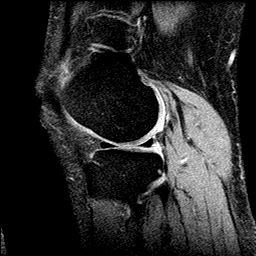
[im 30/35]
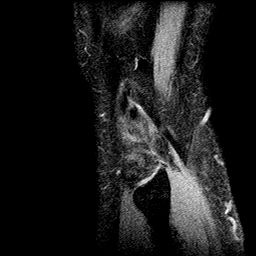
[im 35/35]
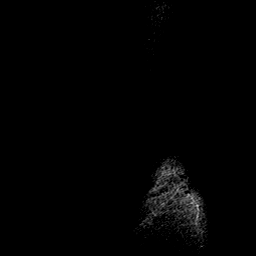

[40 of 40 positions shown; findings below may reference images not displayed]

FINDINGS: MENISCI

Medial meniscus: Irregular or radial tear or confluence of tears in
the midbody with a diminutive residual irregular midbody and
adjacent posterior horn.

Lateral meniscus:  Unremarkable

LIGAMENTS

Cruciates:  Unremarkable

Collaterals:  Unremarkable

CARTILAGE

Patellofemoral:  Unremarkable

Medial:  Mild degenerative chondral thinning.

Lateral:  Unremarkable

Joint:  Trace knee effusion.

Popliteal Fossa:  Unremarkable

Extensor Mechanism:  Unremarkable

Bones:  Geode posteriorly along the tibial spine.

Other: Fatty atrophy of the distal quadriceps musculature, cause
uncertain.
IMPRESSION: 1. Irregular radial tearing of the midbody medial meniscus.
2. Trace knee effusion.
3. Mild degenerative chondral thinning in the medial compartment.
4. Fatty atrophy of the distal quadriceps musculature, cause
uncertain.

## 2019-03-10 ENCOUNTER — Ambulatory Visit: Payer: BC Managed Care – PPO | Attending: Internal Medicine

## 2019-03-10 DIAGNOSIS — Z20822 Contact with and (suspected) exposure to covid-19: Secondary | ICD-10-CM

## 2019-03-11 LAB — NOVEL CORONAVIRUS, NAA: SARS-CoV-2, NAA: NOT DETECTED

## 2019-03-17 ENCOUNTER — Ambulatory Visit: Payer: BC Managed Care – PPO | Attending: Internal Medicine

## 2019-03-17 DIAGNOSIS — Z20822 Contact with and (suspected) exposure to covid-19: Secondary | ICD-10-CM

## 2019-03-18 LAB — NOVEL CORONAVIRUS, NAA: SARS-CoV-2, NAA: NOT DETECTED

## 2021-06-25 ENCOUNTER — Ambulatory Visit: Payer: Self-pay

## 2022-01-04 ENCOUNTER — Ambulatory Visit (LOCAL_COMMUNITY_HEALTH_CENTER): Payer: Medicare Other

## 2022-01-04 DIAGNOSIS — Z719 Counseling, unspecified: Secondary | ICD-10-CM

## 2022-01-04 DIAGNOSIS — Z23 Encounter for immunization: Secondary | ICD-10-CM | POA: Diagnosis not present

## 2022-01-04 NOTE — Progress Notes (Signed)
  Are you feeling sick today? No   Have you ever received a dose of COVID-19 Vaccine? [employer], [place], [place], [place], Other) Yes  If yes, which vaccine and how many doses?   6 doses Pfizer   Did you bring the vaccination record card or other documentation?  Yes   Do you have a health condition or are undergoing treatment that makes you moderately or severely immunocompromised? This would include, but not be limited to: cancer, HIV, organ transplant, immunosuppressive therapy/high-dose corticosteroids, or moderate/severe primary immunodeficiency.  No  Have you received COVID-19 vaccine before or during hematopoietic cell transplant (HCT) or CAR-T-cell therapies? No  Have you ever had an allergic reaction to: (This would include a severe allergic reaction or a reaction that caused hives, swelling, or respiratory distress, including wheezing.) A component of a COVID-19 vaccine or a previous dose of COVID-19 vaccine? No   Have you ever had an allergic reaction to another vaccine (other thanCOVID-19 vaccine) or an injectable medication? (This would include a severe allergic reaction or a reaction that caused hives, swelling, or respiratory distress, including wheezing.)   No    Do you have a history of any of the following:  Myocarditis or Pericarditis No  Dermal fillers:  No  Multisystem Inflammatory Syndrome (MIS-C or MIS-A)? No  COVID-19 disease within the past 3 months? No  Vaccinated with monkeypox vaccine in the last 4 weeks? No  VIS provided.  Tolerated well.  COVID vaccine/Pfizer IM right deltoid.  [facility] and COVID card updated and provided.

## 2023-01-20 ENCOUNTER — Other Ambulatory Visit: Payer: Self-pay

## 2023-01-20 ENCOUNTER — Encounter: Payer: Self-pay | Admitting: Emergency Medicine

## 2023-01-20 ENCOUNTER — Emergency Department
Admission: EM | Admit: 2023-01-20 | Discharge: 2023-01-20 | Disposition: A | Discharge status: Home / Self Care | Payer: Medicare Other | Attending: Emergency Medicine | Admitting: Emergency Medicine

## 2023-01-20 DIAGNOSIS — I10 Essential (primary) hypertension: Secondary | ICD-10-CM | POA: Insufficient documentation

## 2023-01-20 NOTE — ED Triage Notes (Addendum)
Patient presents to ED for elevated BP. Reports high readings (195/125) at home. Sent from [facility] for BP readings.   Denies headache or lightheadedness.   Recently started on lisinopril and atorvastatin by PC, last taken last night.

## 2023-01-20 NOTE — ED Notes (Signed)
 Pt d/c home per EDP order. Discharge summary reviewed, pt verbalizes understanding. Ambulatory off unit. NAD.

## 2023-01-20 NOTE — Discharge Instructions (Signed)
Please continue to take your blood pressure medication as prescribed by your doctor.  Follow-up with your doctor next month as scheduled.  Return to the emergency department for any chest pain shortness of breath severe headache or any other symptom concerning to yourself.  As we discussed otherwise please obtain a blood pressure cuff and check your blood pressure at home every 3 to 4 days preferably in the morning and keep a log of these pressures this will help your doctor decide if any further medication is needed during your follow-up appointment next month.

## 2023-01-20 NOTE — ED Notes (Signed)
First nurse note: Pt here from [facility] clinic, pt here for hypertension, pt has HX of same.

## 2023-01-20 NOTE — ED Provider Notes (Signed)
[facility] Provider Note    Event Date/Time   First MD Initiated Contact with Patient 01/20/23 1702     (approximate)  History   Chief Complaint: Hypertension  HPI  Ronald Watson is a 67 y.o. male with a past medical history of arthritis who presents to the emergency department for high blood pressure.  According to the patient he checked his blood pressure at home and is 195/125.  He was concerned so he went to [facility] clinic walk-in, he was already down to 170s or so by then however they recommended that he come to the emergency department for evaluation.  Here the patient is awake alert no complaints.  Blood pressures down to 150s on recheck 140s.  Most recent 149/97.  Patient has no complaints denies any chest pain shortness of breath headache weakness or numbness in any point.  Patient was just started on her blood pressure medication 4 days ago by his primary care doctor and has a follow-up appointment scheduled for next month.  Physical Exam   Triage Vital Signs: ED Triage Vitals  Encounter Vitals Group     BP 01/20/23 1543 (!) 159/104     Systolic BP Percentile --      Diastolic BP Percentile --      Pulse Rate 01/20/23 1543 88     Resp 01/20/23 1543 18     Temp 01/20/23 1543 98.2 F (36.8 C)     Temp Source 01/20/23 1543 Oral     SpO2 01/20/23 1543 98 %     Weight 01/20/23 1543 205 lb (93 kg)     Height 01/20/23 1543 5\' 11"  (1.803 m)     Head Circumference --      Peak Flow --      Pain Score 01/20/23 1544 0     Pain Loc --      Pain Education --      Exclude from Growth Chart --     Most recent vital signs: Vitals:   01/20/23 1543 01/20/23 1653  BP: (!) 159/104 (!) 146/101  Pulse: 88   Resp: 18   Temp: 98.2 F (36.8 C)   SpO2: 98%     General: Awake, no distress.  CV:  Good peripheral perfusion.  Regular rate and rhythm  Resp:  Normal effort.  Equal breath sounds bilaterally.  Abd:  No distention.  Soft, nontender.  No  rebound or guarding.  ED Results / Procedures / Treatments   MEDICATIONS ORDERED IN ED: Medications - No data to display   IMPRESSION / MDM / ASSESSMENT AND PLAN / ED COURSE  I reviewed the triage vital signs and the nursing notes.  Patient's presentation is most consistent with acute illness / injury with system symptoms.  Patient presents to the emergency department for hypertension.  Overall the patient appears well.  Blood pressures come down on its own currently 149/97.  Patient was started on lisinopril 4 days ago.  Discussed with the patient the importance to continue taking this medication as well as to check his blood pressure every 3 to 4 days at home to keep a log so when he follows up with his PCP next month they can decide if any further medications will be needed at that time.  Patient has no symptoms no chest pain no shortness of breath or other concerns.  We will discharge the patient with PCP follow-up.  Patient agreeable to plan.  FINAL CLINICAL IMPRESSION(S) / ED DIAGNOSES  Hypertension  Note:  This document was prepared using Dragon voice recognition software and may include unintentional dictation errors.   Minna Antis, MD 01/20/23 [phone]

## 2023-12-09 ENCOUNTER — Ambulatory Visit
Admission: RE | Admit: 2023-12-09 | Discharge: 2023-12-09 | Disposition: A | Source: Ambulatory Visit | Attending: Family Medicine | Admitting: Family Medicine

## 2023-12-09 ENCOUNTER — Ambulatory Visit
Admission: RE | Admit: 2023-12-09 | Discharge: 2023-12-09 | Disposition: A | Discharge status: Home / Self Care | Attending: Family Medicine | Admitting: Family Medicine

## 2023-12-09 ENCOUNTER — Other Ambulatory Visit: Payer: Self-pay | Admitting: Family Medicine

## 2023-12-09 DIAGNOSIS — R052 Subacute cough: Secondary | ICD-10-CM
